# Patient Record
Sex: Female | Born: 1960 | Race: White | Hispanic: No | Marital: Single | Smoking: Never smoker
Health system: Southern US, Community
[De-identification: ages and names within clinical notes are randomized; demographics above are authoritative.]

## PROBLEM LIST (undated history)

## (undated) DIAGNOSIS — E785 Hyperlipidemia, unspecified: Secondary | ICD-10-CM

## (undated) DIAGNOSIS — F419 Anxiety disorder, unspecified: Secondary | ICD-10-CM

## (undated) DIAGNOSIS — I10 Essential (primary) hypertension: Secondary | ICD-10-CM

## (undated) DIAGNOSIS — M1990 Unspecified osteoarthritis, unspecified site: Secondary | ICD-10-CM

## (undated) DIAGNOSIS — J302 Other seasonal allergic rhinitis: Secondary | ICD-10-CM

## (undated) DIAGNOSIS — K219 Gastro-esophageal reflux disease without esophagitis: Secondary | ICD-10-CM

## (undated) HISTORY — DX: Gastro-esophageal reflux disease without esophagitis: K21.9

## (undated) HISTORY — DX: Other seasonal allergic rhinitis: J30.2

## (undated) HISTORY — DX: Hyperlipidemia, unspecified: E78.5

## (undated) HISTORY — DX: Essential (primary) hypertension: I10

## (undated) HISTORY — DX: Unspecified osteoarthritis, unspecified site: M19.90

## (undated) HISTORY — DX: Anxiety disorder, unspecified: F41.9

---

## 1998-05-19 ENCOUNTER — Other Ambulatory Visit: Admission: RE | Admit: 1998-05-19 | Discharge: 1998-05-19 | Payer: Self-pay | Admitting: Obstetrics & Gynecology

## 1999-11-06 ENCOUNTER — Emergency Department (HOSPITAL_COMMUNITY): Admission: EM | Admit: 1999-11-06 | Discharge: 1999-11-07 | Payer: Self-pay | Admitting: *Deleted

## 1999-11-07 ENCOUNTER — Encounter: Payer: Self-pay | Admitting: Emergency Medicine

## 1999-12-13 HISTORY — PX: LAPAROSCOPIC CHOLECYSTECTOMY: SUR755

## 2000-04-04 ENCOUNTER — Other Ambulatory Visit: Admission: RE | Admit: 2000-04-04 | Discharge: 2000-04-04 | Payer: Self-pay | Admitting: Obstetrics and Gynecology

## 2001-10-03 ENCOUNTER — Encounter: Admission: RE | Admit: 2001-10-03 | Discharge: 2001-10-03 | Payer: Self-pay | Admitting: Gastroenterology

## 2001-10-03 ENCOUNTER — Encounter: Payer: Self-pay | Admitting: Gastroenterology

## 2001-10-04 ENCOUNTER — Encounter: Admission: RE | Admit: 2001-10-04 | Discharge: 2001-10-04 | Payer: Self-pay | Admitting: Gastroenterology

## 2001-10-04 ENCOUNTER — Encounter: Payer: Self-pay | Admitting: Gastroenterology

## 2001-12-31 ENCOUNTER — Other Ambulatory Visit: Admission: RE | Admit: 2001-12-31 | Discharge: 2001-12-31 | Payer: Self-pay | Admitting: Obstetrics and Gynecology

## 2002-01-31 ENCOUNTER — Encounter: Payer: Self-pay | Admitting: Obstetrics and Gynecology

## 2002-01-31 ENCOUNTER — Ambulatory Visit (HOSPITAL_COMMUNITY): Admission: RE | Admit: 2002-01-31 | Discharge: 2002-01-31 | Payer: Self-pay | Admitting: Obstetrics and Gynecology

## 2002-04-24 ENCOUNTER — Ambulatory Visit (HOSPITAL_COMMUNITY): Admission: RE | Admit: 2002-04-24 | Discharge: 2002-04-24 | Payer: Self-pay | Admitting: Gastroenterology

## 2002-05-11 ENCOUNTER — Emergency Department (HOSPITAL_COMMUNITY): Admission: EM | Admit: 2002-05-11 | Discharge: 2002-05-12 | Payer: Self-pay | Admitting: Emergency Medicine

## 2002-05-12 ENCOUNTER — Encounter: Payer: Self-pay | Admitting: Emergency Medicine

## 2003-03-27 ENCOUNTER — Other Ambulatory Visit: Admission: RE | Admit: 2003-03-27 | Discharge: 2003-03-27 | Payer: Self-pay | Admitting: Obstetrics and Gynecology

## 2003-05-06 ENCOUNTER — Inpatient Hospital Stay (HOSPITAL_COMMUNITY): Admission: RE | Admit: 2003-05-06 | Discharge: 2003-05-09 | Payer: Self-pay | Admitting: Obstetrics and Gynecology

## 2004-12-12 HISTORY — PX: ABDOMINAL HYSTERECTOMY: SHX81

## 2006-03-29 ENCOUNTER — Ambulatory Visit (HOSPITAL_COMMUNITY): Admission: RE | Admit: 2006-03-29 | Discharge: 2006-03-29 | Payer: Self-pay | Admitting: Gastroenterology

## 2006-04-28 ENCOUNTER — Emergency Department (HOSPITAL_COMMUNITY): Admission: EM | Admit: 2006-04-28 | Discharge: 2006-04-28 | Payer: Self-pay | Admitting: Emergency Medicine

## 2006-05-03 ENCOUNTER — Encounter: Admission: RE | Admit: 2006-05-03 | Discharge: 2006-05-03 | Payer: Self-pay | Admitting: Gastroenterology

## 2012-04-09 ENCOUNTER — Encounter: Payer: Self-pay | Admitting: Gastroenterology

## 2012-07-17 ENCOUNTER — Ambulatory Visit (INDEPENDENT_AMBULATORY_CARE_PROVIDER_SITE_OTHER): Payer: Self-pay | Admitting: Family Medicine

## 2012-07-17 VITALS — BP 142/80 | HR 107 | Temp 98.2°F | Resp 18 | Ht 67.0 in | Wt 180.0 lb

## 2012-07-17 DIAGNOSIS — M7022 Olecranon bursitis, left elbow: Secondary | ICD-10-CM

## 2012-07-17 DIAGNOSIS — M7020 Olecranon bursitis, unspecified elbow: Secondary | ICD-10-CM

## 2012-07-17 MED ORDER — DOXYCYCLINE HYCLATE 100 MG PO TABS: 100.0000 mg | ORAL_TABLET | Freq: Two times a day (BID) | ORAL | Status: AC

## 2012-07-17 NOTE — Progress Notes (Signed)
51 year old woman who comes in with left elbow pain and swelling along with redness and warmth. She had a headache yesterday.  Her problem began after she bumped her elbow couple weeks ago and then recently (2 days ago) try popping a pimple. The subsequent redness, pain, warmth rapidly developed for the last 24 hours.   Objective: No acute distress  Left elbow shows full range of motion at the olecranon area of the left elbow is red, warm, and there is a small effusion in the bursal sac   1. Olecranon bursitis of left elbow  doxycycline (VIBRA-TABS) 100 MG tablet

## 2012-07-19 ENCOUNTER — Ambulatory Visit: Payer: Self-pay | Admitting: Internal Medicine

## 2012-07-19 VITALS — BP 125/81 | HR 96 | Temp 98.3°F | Resp 18 | Ht 67.0 in | Wt 179.0 lb

## 2012-07-19 DIAGNOSIS — L02419 Cutaneous abscess of limb, unspecified: Secondary | ICD-10-CM

## 2012-07-19 DIAGNOSIS — M25522 Pain in left elbow: Secondary | ICD-10-CM

## 2012-07-19 DIAGNOSIS — IMO0002 Reserved for concepts with insufficient information to code with codable children: Secondary | ICD-10-CM

## 2012-07-19 DIAGNOSIS — M25529 Pain in unspecified elbow: Secondary | ICD-10-CM

## 2012-07-19 MED ORDER — HYDROCODONE-ACETAMINOPHEN 5-500 MG PO TABS: 1.0000 | ORAL_TABLET | Freq: Three times a day (TID) | ORAL | Status: DC | PRN

## 2012-07-19 MED ORDER — CEFTRIAXONE SODIUM 1 G IJ SOLR
1.0000 g | Freq: Once | INTRAMUSCULAR | Status: AC
  Administered 2012-07-19: 1 g via INTRAMUSCULAR

## 2012-07-19 NOTE — Progress Notes (Signed)
Procedure: Left elbow abscess aspiration   VCO  Sterile prep with betadine swab then alcohol swab. Local anesthesia with Lidocaine 2% plain, 3 cc by medial approach. Betadine swab/alcohol repeated. Abscess aspirated with 10 cc syringe/20 gauge needle. 2.5 cc serosanguinous fluid aspirated. Cleansed and dressed. Area of cellulitis marked with pen

## 2012-07-19 NOTE — Progress Notes (Signed)
  Subjective:    Patient ID: Regina Mercado, female    DOB: Apr 14, 1961, 51 y.o.   MRN: [id number]  HPI Elbow is worse, has abscess at olecranon and is larger and erythema has spread up and down from elbow. Pain worse Doxy is causing esophageal ache, explained how to take doxy properly.  Review of Systems     Objective:   Physical Exam Red warm left elbow and arm Full rom, nms intact       Assessment & Plan:  Rocephin 1g I and D abscess Wound care [facility]

## 2012-07-19 NOTE — Patient Instructions (Signed)

## 2012-07-20 ENCOUNTER — Encounter: Payer: Self-pay | Admitting: Internal Medicine

## 2012-07-20 ENCOUNTER — Ambulatory Visit (INDEPENDENT_AMBULATORY_CARE_PROVIDER_SITE_OTHER): Payer: Self-pay | Admitting: Internal Medicine

## 2012-07-20 VITALS — BP 132/72 | HR 111 | Temp 99.1°F | Resp 16 | Ht 66.0 in | Wt 179.0 lb

## 2012-07-20 DIAGNOSIS — M25539 Pain in unspecified wrist: Secondary | ICD-10-CM

## 2012-07-20 DIAGNOSIS — L0291 Cutaneous abscess, unspecified: Secondary | ICD-10-CM

## 2012-07-20 LAB — POCT CBC
HCT, POC: 42.2 % (ref 37.7–47.9)
MCH, POC: 28.2 pg (ref 27–31.2)
MCV: 90.9 fL (ref 80–97)
MID (cbc): 0.6 (ref 0–0.9)
POC Granulocyte: 7.2 — AB (ref 2–6.9)
POC LYMPH PERCENT: 21.4 %L (ref 10–50)
Platelet Count, POC: 325 10*3/uL (ref 142–424)
RDW, POC: 13.8 %

## 2012-07-20 MED ORDER — CEFTRIAXONE SODIUM 1 G IJ SOLR
1.0000 g | Freq: Once | INTRAMUSCULAR | Status: AC
  Administered 2012-07-20: 1 g via INTRAMUSCULAR

## 2012-07-20 NOTE — Progress Notes (Signed)
  Subjective:    Patient ID: Regina Mercado, female    DOB: [birth year], 51 y.o.   MRN: [id number]  HPI Has cellulitis and pain left elbow, no better today.See w/up from yesterday. Has no fever and no systemic sxs. Feels well except for arm No insurance  Review of Systems     Objective:   Physical Exam Left elbow red, warm, swollen, and tender   Cbc Results for orders placed in visit on 07/20/12  POCT CBC      Component Value Range   WBC 10.0  4.6 - 10.2 K/uL   Lymph, poc 2.1  0.6 - 3.4   POC LYMPH PERCENT 21.4  10 - 50 %L   MID (cbc) 0.6  0 - 0.9   POC MID % 6.1  0 - 12 %M   POC Granulocyte 7.2 (*) 2 - 6.9   Granulocyte percent 72.5  37 - 80 %G   RBC 4.64  4.04 - 5.48 M/uL   Hemoglobin 13.1  12.2 - 16.2 g/dL   HCT, POC 40.9  81.1 - 47.9 %   MCV 90.9  80 - 97 fL   MCH, POC 28.2  27 - 31.2 pg   MCHC 31.0 (*) 31.8 - 35.4 g/dL   RDW, POC 91.4     Platelet Count, POC 325  142 - 424 K/uL   MPV 9.5  0 - 99.8 fL       Assessment & Plan:  Rocephin 1g Rest/warm salt soaks/sling RTC  Am and see Dr. Cleta Alberts consider rocephin

## 2012-07-21 ENCOUNTER — Encounter (HOSPITAL_COMMUNITY): Payer: Self-pay | Admitting: Family Medicine

## 2012-07-21 ENCOUNTER — Emergency Department (HOSPITAL_COMMUNITY)
Admission: EM | Admit: 2012-07-21 | Discharge: 2012-07-21 | Disposition: A | Discharge status: Home / Self Care | Payer: Self-pay | Attending: Emergency Medicine | Admitting: Emergency Medicine

## 2012-07-21 DIAGNOSIS — IMO0002 Reserved for concepts with insufficient information to code with codable children: Secondary | ICD-10-CM | POA: Insufficient documentation

## 2012-07-21 DIAGNOSIS — L03114 Cellulitis of left upper limb: Secondary | ICD-10-CM

## 2012-07-21 LAB — BASIC METABOLIC PANEL
CO2: 27 mEq/L (ref 19–32)
Chloride: 102 mEq/L (ref 96–112)
Creatinine, Ser: 0.58 mg/dL (ref 0.50–1.10)
GFR calc Af Amer: >90 mL/min (ref >90)
GFR calc non Af Amer: >90 mL/min (ref >90)
Glucose, Bld: 110 mg/dL — ABNORMAL HIGH (ref 70–99)
Sodium: 138 mEq/L (ref 135–145)

## 2012-07-21 LAB — CBC WITH DIFFERENTIAL/PLATELET
Basophils Absolute: 0 10*3/uL (ref 0.0–0.1)
Basophils Relative: 0 % (ref 0–1)
Eosinophils Absolute: 0.2 10*3/uL (ref 0.0–0.7)
Hemoglobin: 11.7 g/dL — ABNORMAL LOW (ref 12.0–15.0)
Lymphocytes Relative: 26 % (ref 12–46)
MCH: 29 pg (ref 26.0–34.0)
MCHC: 33.6 g/dL (ref 30.0–36.0)
Monocytes Absolute: 0.7 10*3/uL (ref 0.1–1.0)
Neutro Abs: 5.3 10*3/uL (ref 1.7–7.7)
Platelets: 281 10*3/uL (ref 150–400)
RBC: 4.03 MIL/uL (ref 3.87–5.11)
RDW: 12.6 % (ref 11.5–15.5)
WBC: 8.3 10*3/uL (ref 4.0–10.5)

## 2012-07-21 MED ORDER — CLINDAMYCIN PHOSPHATE 300 MG/50ML IV SOLN
300.0000 mg | Freq: Once | INTRAVENOUS | Status: AC
  Administered 2012-07-21: 300 mg via INTRAVENOUS
  Filled 2012-07-21: qty 50

## 2012-07-21 NOTE — ED Notes (Signed)
Patient has area of redness to her left forearm. Line of demarkation marked by urgent care ctr that has been following her care. States that she had her left elbow drained and is currently on Doxycycline. Has had to return to the urgent care ctr at least 3 times for increased redness and received Rocephin injections. Here tonite because area of redness is extending past line of demarkation.

## 2012-07-21 NOTE — ED Provider Notes (Signed)
History     CSN: [id number]  Arrival date & time 07/21/12  4098   First MD Initiated Contact with Patient 07/21/12 [phone]      Chief Complaint  Patient presents with  . Cellulitis    (Consider location/radiation/quality/duration/timing/severity/associated sxs/prior treatment) HPI  51 year old female presents for evaluation and management of abscess cellulitis on the left elbow. Patient reports she accidentally bumped the elbow several weeks ago and then 5 days ago she saw a pimple and attempt to pop it. She notice subsequent warmth, redness, and swelling which started a day after that incident.  She has been to Urgent care several times for her complaints.  Was given abx including rocephin, and currently taking Doxycycline.  Had an I&D procedure 2 days ago to L elbow.  Was recommended to return if worsen. Sts she still notices swelling to hand, pain, and no improvement.  Denies fever, chills, n/v, numbness or bleeding.  No hx of diabetes.     History reviewed. No pertinent past medical history.  Past Surgical History  Procedure Date  . Abdominal hysterectomy     No family history on file.  History  Substance Use Topics  . Smoking status: Never Smoker   . Smokeless tobacco: Not on file  . Alcohol Use: No    OB History    Grav Para Term Preterm Abortions TAB SAB Ect Mult Living                  Review of Systems  All other systems reviewed and are negative.    Allergies  Erythromycin and Sudafed  Home Medications   Current Outpatient Rx  Name Route Sig Dispense Refill  . DOXYCYCLINE HYCLATE 100 MG PO TABS Oral Take 1 tablet (100 mg total) by mouth 2 (two) times daily. 20 tablet 0  . HYDROCODONE-ACETAMINOPHEN 5-500 MG PO TABS Oral Take 1 tablet by mouth every 8 (eight) hours as needed for pain. 36 tablet 0  . IMIPRAMINE HCL 50 MG PO TABS Oral Take 50 mg by mouth at bedtime.    Marland Kitchen RANITIDINE HCL 150 MG PO TABS Oral Take 150 mg by mouth 2 (two) times daily.       BP 150/94  Pulse 104  Temp 97.8 F (36.6 C) (Oral)  Resp 16  SpO2 98%  Physical Exam  Nursing note and vitals reviewed. Constitutional: She is oriented to person, place, and time. She appears well-developed and well-nourished. No distress.  HENT:  Head: Normocephalic and atraumatic.  Mouth/Throat: Oropharynx is clear and moist.  Eyes: Conjunctivae are normal.  Neck: Normal range of motion. Neck supple.  Cardiovascular: Normal rate and regular rhythm.  Exam reveals no gallop and no friction rub.   No murmur heard. Pulmonary/Chest: Effort normal. No respiratory distress. She exhibits no tenderness.  Abdominal: Soft. There is no tenderness.  Musculoskeletal: She exhibits edema.       Dependent edema to L hand  Neurological: She is alert and oriented to person, place, and time.  Skin: Skin is warm. Rash noted.       Very faint receding erythema to L forearm and elbow from marked margin.  Evidence of dependent edema to L hand.  L elbow with mild edema and erythema to olecranon region, mildly tender on palpation.  No petechia, pustular, or vesicular lesion.    Psychiatric: She has a normal mood and affect.    ED Course  Procedures (including critical care time)   Labs Reviewed  CBC WITH DIFFERENTIAL  BASIC METABOLIC PANEL   Results for orders placed during the hospital encounter of 07/21/12  CBC WITH DIFFERENTIAL      Component Value Range   WBC 8.3  4.0 - 10.5 K/uL   RBC 4.03  3.87 - 5.11 MIL/uL   Hemoglobin 11.7 (*) 12.0 - 15.0 g/dL   HCT 40.9 (*) 81.1 - 91.4 %   MCV 86.4  78.0 - 100.0 fL   MCH 29.0  26.0 - 34.0 pg   MCHC 33.6  30.0 - 36.0 g/dL   RDW 78.2  95.6 - 21.3 %   Platelets 281  150 - 400 K/uL   Neutrophils Relative 63  43 - 77 %   Neutro Abs 5.3  1.7 - 7.7 K/uL   Lymphocytes Relative 26  12 - 46 %   Lymphs Abs 2.2  0.7 - 4.0 K/uL   Monocytes Relative 8  3 - 12 %   Monocytes Absolute 0.7  0.1 - 1.0 K/uL   Eosinophils Relative 2  0 - 5 %   Eosinophils  Absolute 0.2  0.0 - 0.7 K/uL   Basophils Relative 0  0 - 1 %   Basophils Absolute 0.0  0.0 - 0.1 K/uL  BASIC METABOLIC PANEL      Component Value Range   Sodium 138  135 - 145 mEq/L   Potassium 3.6  3.5 - 5.1 mEq/L   Chloride 102  96 - 112 mEq/L   CO2 27  19 - 32 mEq/L   Glucose, Bld 110 (*) 70 - 99 mg/dL   BUN 9  6 - 23 mg/dL   Creatinine, Ser 0.86  0.50 - 1.10 mg/dL   Calcium 8.8  8.4 - 57.8 mg/dL   GFR calc non Af Amer >90  >90 mL/min   GFR calc Af Amer >90  >90 mL/min   No results found.   1. L forearm cellulitis, resolving  MDM  Improving cellulitis of L forearm.  Pt currently taking doxy.  Since pt still apprehensive and continue to c/o of swelling and redness, will give clindamycin 300mg  IV here in ED.  Reassurance given.  Recommend continue doxy.  She is currently afebrile, nontoxic in appearance.     5:34 AM VSS, afebrile, normal WBC.  Pt finished receiving her IV abx here in ED.  Request to be discharge.  Pt will continue her medication.  Strict return precaution discussed.       Fayrene Helper, PA-C 07/21/12 [phone]

## 2012-07-21 NOTE — ED Notes (Signed)
Per patient she is currently on Doxycycline but redness looks like it has been spreading.

## 2012-07-22 LAB — WOUND CULTURE
Gram Stain: NONE SEEN
Gram Stain: NONE SEEN

## 2012-07-22 NOTE — ED Provider Notes (Signed)
Medical screening examination/treatment/procedure(s) were performed by non-physician practitioner and as supervising physician I was immediately available for consultation/collaboration.  Raeford Razor, MD 07/22/12 [phone]

## 2012-07-28 ENCOUNTER — Telehealth: Payer: Self-pay

## 2012-07-28 NOTE — Telephone Encounter (Signed)
PATIENT STATES SHE CAME IN FOR CELULITIS OF HER (L) ELBOW. IT IS DOING A LOT BETTER BECAUSE IT IS NOT AS PAINFUL AS IT WAS. HOWEVER, IT IS STILL RED, SWOLLEN AND PEELING. SHE THINKS SHE MAY NEED TO GET ANOTHER ROUND OF THE ANTIBOTIC.  BEST PHONE [phone] BEFORE 5:00.  AFTER 5:00 PLEASE CALL [phone] (CELL)   PHARMACY CHOICE IS [facility] ([address])  MBC

## 2012-07-31 ENCOUNTER — Telehealth: Payer: Self-pay

## 2012-07-31 MED ORDER — DOXYCYCLINE HYCLATE 100 MG PO CAPS: 100.0000 mg | ORAL_CAPSULE | Freq: Two times a day (BID) | ORAL | Status: AC

## 2012-07-31 NOTE — Telephone Encounter (Signed)
I sent in another round of Doxycycline for her. If she has any concerns that this is not healing I want her to come in right away.

## 2012-07-31 NOTE — Telephone Encounter (Signed)
Notified pt that RF was sent in and gave instr's for RTC if not improving. Pt agreed.

## 2012-07-31 NOTE — Telephone Encounter (Signed)
Pt is returning [place] Riggs's call from earlier today.  Best# [phone]

## 2012-07-31 NOTE — Telephone Encounter (Signed)
LMOM to CB on cell # [phone] (it is listed incorrectly in phone message)

## 2013-04-18 ENCOUNTER — Other Ambulatory Visit: Payer: Self-pay | Admitting: Family Medicine

## 2013-04-18 ENCOUNTER — Other Ambulatory Visit: Payer: Self-pay

## 2013-04-18 DIAGNOSIS — Z1231 Encounter for screening mammogram for malignant neoplasm of breast: Secondary | ICD-10-CM

## 2013-05-24 ENCOUNTER — Ambulatory Visit
Admission: RE | Admit: 2013-05-24 | Discharge: 2013-05-24 | Disposition: A | Discharge status: Home / Self Care | Payer: BC Managed Care – PPO | Source: Ambulatory Visit | Attending: Family Medicine | Admitting: Family Medicine

## 2013-05-24 DIAGNOSIS — Z1231 Encounter for screening mammogram for malignant neoplasm of breast: Secondary | ICD-10-CM

## 2013-07-03 ENCOUNTER — Encounter: Payer: Self-pay | Admitting: Gastroenterology

## 2013-09-13 ENCOUNTER — Encounter: Payer: Self-pay | Admitting: Gastroenterology

## 2013-09-13 ENCOUNTER — Ambulatory Visit (AMBULATORY_SURGERY_CENTER): Payer: Self-pay | Admitting: *Deleted

## 2013-09-13 VITALS — Ht 68.0 in | Wt 186.4 lb

## 2013-09-13 DIAGNOSIS — Z1211 Encounter for screening for malignant neoplasm of colon: Secondary | ICD-10-CM

## 2013-09-13 MED ORDER — PREPOPIK 10-3.5-12 MG-GM-GM PO PACK: 1.0000 | PACK | Freq: Once | ORAL | Status: DC

## 2013-09-13 NOTE — Progress Notes (Signed)
No egg or soy allergy. No anesthesia problems.  

## 2013-09-18 ENCOUNTER — Telehealth: Payer: Self-pay | Admitting: Gastroenterology

## 2013-09-18 NOTE — Telephone Encounter (Signed)
Spoke with pt and told her I would leave a prep sample at the desk for her to pick up.  Understanding voiced

## 2013-09-27 ENCOUNTER — Telehealth: Payer: Self-pay | Admitting: *Deleted

## 2013-09-27 ENCOUNTER — Encounter: Payer: BC Managed Care – PPO | Admitting: Gastroenterology

## 2013-09-27 ENCOUNTER — Telehealth: Payer: Self-pay | Admitting: Gastroenterology

## 2013-09-27 NOTE — Telephone Encounter (Signed)
Returned patient's call and she states that she took one of her daughter's Zofran at 8 am and is still feeling very nauseated.  She has not started her Mag. Citrate or taken Phenergan suppositories as prescribed by Dr. Russella Dar.  She states that her stools are still brown.  I told her I would talk with Dr. Russella Dar and call her back.   I spoke with Dr. Russella Dar and he suggested that we do her at 1:00 pm and for patient to take the phenergan suppository now as prescribed.  I asked Dorene Grebe and she said we could do her at 1:00 pm.  I  Called patient and she said she would be here at 12:30 pm and will take the phenergan and mag. Citrate.  I advised her to call us if she was still unable to take the prep and we would reschedule her appointment.  She understood and agreed.

## 2013-09-27 NOTE — Telephone Encounter (Signed)
Called pt to see how she was doing with prep.  She says she was able to finish one bottle of the prep but felt nauseated afterwards.  She has not moved her bowels since then.  Her stools are watery brown but still have solid.  She continues to feel nauseated.  Spoke with Dr Russella Dar and he feels should reschedule and that it would be best to premedicate for her nausea before prepping.  I returned her call and explained all of this to her.  She will call back to reschedule at her convenience.

## 2013-09-27 NOTE — Telephone Encounter (Signed)
On call note. Pt ongoing vomiting this am with second dose of Prepopik. Given Phenergan 25 mg supp q6h #2 and changed to Mag Citrate x 2 to be taken after Phenergan

## 2013-11-20 ENCOUNTER — Encounter: Payer: Self-pay | Admitting: Gastroenterology

## 2014-01-07 ENCOUNTER — Encounter: Payer: Self-pay | Admitting: Gastroenterology

## 2014-01-31 ENCOUNTER — Encounter: Payer: BC Managed Care – PPO | Admitting: Gastroenterology

## 2014-02-21 ENCOUNTER — Encounter: Payer: Self-pay | Admitting: *Deleted

## 2014-03-07 ENCOUNTER — Encounter: Payer: BC Managed Care – PPO | Admitting: Gastroenterology

## 2014-03-21 ENCOUNTER — Encounter: Payer: BC Managed Care – PPO | Admitting: Gastroenterology

## 2014-04-21 ENCOUNTER — Telehealth: Payer: Self-pay | Admitting: *Deleted

## 2014-04-21 NOTE — Telephone Encounter (Signed)
Let's try MoviPrep with Reglan 10 mg before each dose.

## 2014-04-21 NOTE — Telephone Encounter (Signed)
Dr Russella DarStark: pt is scheduled for direct screening colonoscopy 5/27.  She was previously scheduled in October but was unable to tolerate Prepopik.  (See phone note 09/27/13)  I talked with pt by phone and she says she only took evening dose of Prepopik and did not try to take Magnesium Citrate as suggested because of nausea.  [facility] What do you want her to do for prep and what do you want her to take for nausea med prior to taking prep?  Thanks, Olegario MessierKathy

## 2014-04-21 NOTE — Telephone Encounter (Signed)
noted 

## 2014-04-24 ENCOUNTER — Ambulatory Visit (AMBULATORY_SURGERY_CENTER): Payer: Self-pay | Admitting: *Deleted

## 2014-04-24 VITALS — Ht 69.0 in | Wt 194.2 lb

## 2014-04-24 DIAGNOSIS — Z1211 Encounter for screening for malignant neoplasm of colon: Secondary | ICD-10-CM

## 2014-04-24 MED ORDER — MOVIPREP 100 G PO SOLR: ORAL | Status: DC

## 2014-04-24 MED ORDER — METOCLOPRAMIDE HCL 10 MG PO TABS: 10.0000 mg | ORAL_TABLET | ORAL | Status: AC

## 2014-04-24 NOTE — Progress Notes (Signed)
No allergies to eggs or soy. No problems with anesthesia.  Pt given Emmi instructions for colonoscopy  No oxygen use  No diet drug use  

## 2014-04-28 ENCOUNTER — Encounter: Payer: Self-pay | Admitting: Gastroenterology

## 2014-05-01 ENCOUNTER — Telehealth: Payer: Self-pay | Admitting: Gastroenterology

## 2014-05-01 NOTE — Telephone Encounter (Signed)
[facility] free MoviPrep

## 2014-05-07 ENCOUNTER — Encounter: Payer: Self-pay | Admitting: Gastroenterology

## 2014-05-07 ENCOUNTER — Ambulatory Visit (AMBULATORY_SURGERY_CENTER): Payer: BC Managed Care – PPO | Admitting: Gastroenterology

## 2014-05-07 VITALS — BP 155/88 | HR 90 | Temp 98.4°F | Resp 16 | Ht 69.0 in | Wt 194.0 lb

## 2014-05-07 DIAGNOSIS — Z1211 Encounter for screening for malignant neoplasm of colon: Secondary | ICD-10-CM

## 2014-05-07 MED ORDER — SODIUM CHLORIDE 0.9 % IV SOLN: 500.0000 mL | INTRAVENOUS | Status: DC

## 2014-05-07 NOTE — Patient Instructions (Signed)
YOU HAD AN ENDOSCOPIC PROCEDURE TODAY AT THE [facility]: Refer to the procedure report that was given to you for any specific questions about what was found during the examination.  If the procedure report does not answer your questions, please call your gastroenterologist to clarify.  If you requested that your care partner not be given the details of your procedure findings, then the procedure report has been included in a sealed envelope for you to review at your convenience later.  YOU SHOULD EXPECT: Some feelings of bloating in the abdomen. Passage of more gas than usual.  Walking can help get rid of the air that was put into your GI tract during the procedure and reduce the bloating. If you had a lower endoscopy (such as a colonoscopy or flexible sigmoidoscopy) you may notice spotting of blood in your stool or on the toilet paper. If you underwent a bowel prep for your procedure, then you may not have a normal bowel movement for a few days.  DIET: Your first meal following the procedure should be a light meal and then it is ok to progress to your normal diet.  A half-sandwich or bowl of soup is an example of a good first meal.  Heavy or fried foods are harder to digest and may make you feel nauseous or bloated.  Likewise meals heavy in dairy and vegetables can cause extra gas to form and this can also increase the bloating.  Drink plenty of fluids but you should avoid alcoholic beverages for 24 hours.  ACTIVITY: Your care partner should take you home directly after the procedure.  You should plan to take it easy, moving slowly for the rest of the day.  You can resume normal activity the day after the procedure however you should NOT DRIVE or use heavy machinery for 24 hours (because of the sedation medicines used during the test).    SYMPTOMS TO REPORT IMMEDIATELY: A gastroenterologist can be reached at any hour.  During normal business hours, 8:30 AM to 5:00 PM Monday through Friday,  call [phone].  After hours and on weekends, please call the GI answering service at [phone] who will take a message and have the physician on call contact you.   Following lower endoscopy (colonoscopy or flexible sigmoidoscopy):  Excessive amounts of blood in the stool  Significant tenderness or worsening of abdominal pains  Swelling of the abdomen that is new, acute  Fever of 100F or higher    FOLLOW UP: If any biopsies were taken you will be contacted by phone or by letter within the next 1-3 weeks.  Call your gastroenterologist if you have not heard about the biopsies in 3 weeks.  Our staff will call the home number listed on your records the next business day following your procedure to check on you and address any questions or concerns that you may have at that time regarding the information given to you following your procedure. This is a courtesy call and so if there is no answer at the home number and we have not heard from you through the emergency physician on call, we will assume that you have returned to your regular daily activities without incident.  SIGNATURES/CONFIDENTIALITY: You and/or your care partner have signed paperwork which will be entered into your electronic medical record.  These signatures attest to the fact that that the information above on your After Visit Summary has been reviewed and is understood.  Full responsibility of the confidentiality   of this discharge information lies with you and/or your care-partner.     

## 2014-05-07 NOTE — Progress Notes (Signed)
Report to PACU, RN, vss, BBS= Clear.  

## 2014-05-07 NOTE — Op Note (Signed)
[facility] [address] [place], [address]   COLONOSCOPY PROCEDURE REPORT  PATIENT: Regina Mercado, Regina Mercado  MR#: [id number] BIRTHDATE: 09/18/1961 , 52  yrs. old GENDER: Female ENDOSCOPIST: Meryl Dare, MD, [facility] REFERRED BY: Marcelo Baldy PROCEDURE DATE:  05/07/2014 PROCEDURE:   Colonoscopy, screening First Screening Colonoscopy - Avg.  risk and is 50 yrs.  old or older Yes.  Prior Negative Screening - Now for repeat screening. N/A  History of Adenoma - Now for follow-up colonoscopy & has been > or = to 3 yrs.  N/A  Polyps Removed Today? No.  Recommend repeat exam, <10 yrs? No. ASA CLASS:   Class II INDICATIONS:average risk screening. MEDICATIONS: MAC sedation, administered by CRNA and propofol (Diprivan) 340mg  IV DESCRIPTION OF PROCEDURE:   After the risks benefits and alternatives of the procedure were thoroughly explained, informed consent was obtained.  A digital rectal exam revealed no abnormalities of the rectum.   The LB [id number]  endoscope was introduced through the anus and advanced to the cecum, which was identified by both the appendix and ileocecal valve. No adverse events experienced with a tortuous colon.   The quality of the prep was good, using MoviPrep  The instrument was then slowly withdrawn as the colon was fully examined.  COLON FINDINGS: A normal appearing cecum, ileocecal valve, and appendiceal orifice were identified.  The ascending, hepatic flexure, transverse, splenic flexure, descending, sigmoid colon and rectum appeared unremarkable.  No polyps or cancers were seen. Retroflexed views revealed internal hemorrhoids. The time to cecum=6 minutes 55 seconds.  Withdrawal time=11 minutes 52 seconds. The scope was withdrawn and the procedure completed.  COMPLICATIONS: There were no complications.  ENDOSCOPIC IMPRESSION: 1.  Normal colon 2.  Internal hemorrhoids  RECOMMENDATIONS: 1.  You should continue to follow colorectal  cancer screening guidelines for "routine risk" patients with a repeat colonoscopy in 10 years.  There is no need for routine, screening FOBT (stool) testing for at least 5 years.  eSigned:  Meryl Dare, MD, [facility] 05/07/2014 1:57 PM

## 2014-05-08 ENCOUNTER — Telehealth: Payer: Self-pay | Admitting: *Deleted

## 2014-05-08 NOTE — Telephone Encounter (Signed)
  Follow up Call-  Call back number 05/07/2014  Post procedure Call Back phone  # [phone]  Permission to leave phone message Yes     Patient questions:  Do you have a fever, pain , or abdominal swelling? no Pain Score  0 *  Have you tolerated food without any problems? yes  Have you been able to return to your normal activities? yes  Do you have any questions about your discharge instructions: Diet   no Medications  no Follow up visit  no  Do you have questions or concerns about your Care? no  Actions: * If pain score is 4 or above: No action needed, pain <4.

## 2014-07-14 ENCOUNTER — Other Ambulatory Visit: Payer: Self-pay

## 2014-07-14 DIAGNOSIS — Z1231 Encounter for screening mammogram for malignant neoplasm of breast: Secondary | ICD-10-CM

## 2014-07-25 ENCOUNTER — Ambulatory Visit: Payer: BC Managed Care – PPO

## 2014-07-28 ENCOUNTER — Ambulatory Visit: Payer: BC Managed Care – PPO

## 2014-08-03 ENCOUNTER — Emergency Department (HOSPITAL_COMMUNITY)
Admission: EM | Admit: 2014-08-03 | Discharge: 2014-08-03 | Disposition: A | Discharge status: Home / Self Care | Payer: BC Managed Care – PPO | Attending: Emergency Medicine | Admitting: Emergency Medicine

## 2014-08-03 ENCOUNTER — Emergency Department (HOSPITAL_COMMUNITY): Payer: BC Managed Care – PPO

## 2014-08-03 ENCOUNTER — Encounter (HOSPITAL_COMMUNITY): Payer: Self-pay | Admitting: Emergency Medicine

## 2014-08-03 DIAGNOSIS — S20219A Contusion of unspecified front wall of thorax, initial encounter: Secondary | ICD-10-CM | POA: Insufficient documentation

## 2014-08-03 DIAGNOSIS — E785 Hyperlipidemia, unspecified: Secondary | ICD-10-CM | POA: Insufficient documentation

## 2014-08-03 DIAGNOSIS — I10 Essential (primary) hypertension: Secondary | ICD-10-CM | POA: Diagnosis not present

## 2014-08-03 DIAGNOSIS — Y9289 Other specified places as the place of occurrence of the external cause: Secondary | ICD-10-CM | POA: Insufficient documentation

## 2014-08-03 DIAGNOSIS — Y9389 Activity, other specified: Secondary | ICD-10-CM | POA: Diagnosis not present

## 2014-08-03 DIAGNOSIS — M1990 Unspecified osteoarthritis, unspecified site: Secondary | ICD-10-CM | POA: Diagnosis not present

## 2014-08-03 DIAGNOSIS — K219 Gastro-esophageal reflux disease without esophagitis: Secondary | ICD-10-CM | POA: Insufficient documentation

## 2014-08-03 DIAGNOSIS — W230XXA Caught, crushed, jammed, or pinched between moving objects, initial encounter: Secondary | ICD-10-CM | POA: Diagnosis not present

## 2014-08-03 DIAGNOSIS — Z79899 Other long term (current) drug therapy: Secondary | ICD-10-CM | POA: Insufficient documentation

## 2014-08-03 DIAGNOSIS — S298XXA Other specified injuries of thorax, initial encounter: Secondary | ICD-10-CM | POA: Insufficient documentation

## 2014-08-03 DIAGNOSIS — F411 Generalized anxiety disorder: Secondary | ICD-10-CM | POA: Diagnosis not present

## 2014-08-03 MED ORDER — NAPROXEN 500 MG PO TABS
500.0000 mg | ORAL_TABLET | Freq: Two times a day (BID) | ORAL | Status: AC
Start: 2014-08-03 — End: ?

## 2014-08-03 MED ORDER — KETOROLAC TROMETHAMINE 30 MG/ML IJ SOLN
30.0000 mg | Freq: Once | INTRAMUSCULAR | Status: AC
  Administered 2014-08-03: 30 mg via INTRAMUSCULAR
  Filled 2014-08-03: qty 1
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
## 2014-08-03 NOTE — Discharge Instructions (Signed)
Chest Contusion A contusion is a deep bruise. Bruises happen when an injury causes bleeding under the skin. Signs of bruising include pain, puffiness (swelling), and discolored skin. The bruise may turn blue, purple, or yellow.  HOME CARE  Put ice on the injured area.  Put ice in a plastic bag.  Place a towel between the skin and the bag.  Leave the ice on for 15-20 minutes at a time, 03-04 times a day for the first 48 hours.  Only take medicine as told by your doctor.  Rest.  Take deep breaths (deep-breathing exercises) as told by your doctor.  Stop smoking if you smoke.  Do not lift objects over 5 pounds (2.3 kilograms) for 3 days or longer if told by your doctor. GET HELP RIGHT AWAY IF:   You have more bruising or puffiness.  You have pain that gets worse.  You have trouble breathing.  You are dizzy, weak, or pass out (faint).  You have blood in your pee (urine) or poop (stool).  You cough up or throw up (vomit) blood.  Your puffiness or pain is not helped with medicines. MAKE SURE YOU:   Understand these instructions.  Will watch your condition.  Will get help right away if you are not doing well or get worse. Document Released: 05/16/2008 Document Revised: 08/22/2012 Document Reviewed: 05/21/2012 [facility] Patient Information 2015 [place], [place]. This information is not intended to replace advice given to you by your health care provider. Make sure you discuss any questions you have with your health care provider. Your chest and sternum xrays are normal
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
## 2014-08-12 NOTE — ED Provider Notes (Signed)
Medical screening examination/treatment/procedure(s) were performed by non-physician practitioner and as supervising physician I was immediately available for consultation/collaboration.   EKG Interpretation None       Hurman Horn, MD 08/12/14 2042

## 2014-08-13 ENCOUNTER — Ambulatory Visit: Payer: BC Managed Care – PPO

## 2014-09-22 ENCOUNTER — Ambulatory Visit
Admission: RE | Admit: 2014-09-22 | Discharge: 2014-09-22 | Disposition: A | Discharge status: Home / Self Care | Payer: BC Managed Care – PPO | Source: Ambulatory Visit

## 2014-09-22 DIAGNOSIS — Z1231 Encounter for screening mammogram for malignant neoplasm of breast: Secondary | ICD-10-CM

## 2015-09-01 ENCOUNTER — Other Ambulatory Visit: Payer: Self-pay

## 2015-09-01 DIAGNOSIS — Z1231 Encounter for screening mammogram for malignant neoplasm of breast: Secondary | ICD-10-CM

## 2015-09-11 ENCOUNTER — Ambulatory Visit: Payer: Self-pay

## 2016-01-11 ENCOUNTER — Ambulatory Visit
Admission: RE | Admit: 2016-01-11 | Discharge: 2016-01-11 | Disposition: A | Discharge status: Home / Self Care | Payer: BLUE CROSS/BLUE SHIELD | Source: Ambulatory Visit

## 2016-01-11 DIAGNOSIS — Z1231 Encounter for screening mammogram for malignant neoplasm of breast: Secondary | ICD-10-CM

## 2017-05-16 ENCOUNTER — Other Ambulatory Visit: Payer: Self-pay | Admitting: Primary Care

## 2017-05-16 DIAGNOSIS — Z1231 Encounter for screening mammogram for malignant neoplasm of breast: Secondary | ICD-10-CM

## 2017-05-30 ENCOUNTER — Ambulatory Visit
Admission: RE | Admit: 2017-05-30 | Discharge: 2017-05-30 | Disposition: A | Discharge status: Home / Self Care | Payer: BLUE CROSS/BLUE SHIELD | Source: Ambulatory Visit | Attending: Primary Care | Admitting: Primary Care

## 2017-05-30 DIAGNOSIS — Z1231 Encounter for screening mammogram for malignant neoplasm of breast: Secondary | ICD-10-CM

## 2018-06-25 ENCOUNTER — Other Ambulatory Visit: Payer: Self-pay | Admitting: Nurse Practitioner

## 2018-06-25 DIAGNOSIS — Z1231 Encounter for screening mammogram for malignant neoplasm of breast: Secondary | ICD-10-CM

## 2018-07-12 ENCOUNTER — Ambulatory Visit
Admission: RE | Admit: 2018-07-12 | Discharge: 2018-07-12 | Disposition: A | Discharge status: Home / Self Care | Payer: Managed Care, Other (non HMO) | Source: Ambulatory Visit | Attending: Nurse Practitioner | Admitting: Nurse Practitioner

## 2018-07-12 DIAGNOSIS — Z1231 Encounter for screening mammogram for malignant neoplasm of breast: Secondary | ICD-10-CM

## 2018-12-03 ENCOUNTER — Emergency Department (HOSPITAL_COMMUNITY)
Admission: EM | Admit: 2018-12-03 | Discharge: 2018-12-04 | Disposition: A | Discharge status: Home / Self Care | Payer: 59 | Attending: Emergency Medicine | Admitting: Emergency Medicine

## 2018-12-03 ENCOUNTER — Encounter (HOSPITAL_COMMUNITY): Payer: Self-pay | Admitting: Emergency Medicine

## 2018-12-03 DIAGNOSIS — I10 Essential (primary) hypertension: Secondary | ICD-10-CM | POA: Diagnosis not present

## 2018-12-03 DIAGNOSIS — R42 Dizziness and giddiness: Secondary | ICD-10-CM | POA: Diagnosis present

## 2018-12-03 DIAGNOSIS — I951 Orthostatic hypotension: Secondary | ICD-10-CM | POA: Diagnosis not present

## 2018-12-03 DIAGNOSIS — Z79899 Other long term (current) drug therapy: Secondary | ICD-10-CM | POA: Diagnosis not present

## 2018-12-03 DIAGNOSIS — E860 Dehydration: Secondary | ICD-10-CM | POA: Diagnosis not present
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
## 2018-12-03 NOTE — ED Notes (Signed)
Patient stated she has not ate or drank anything all day. Jumped up from sitting position and everything went back, patient felt nauseated after too.

## 2018-12-03 NOTE — ED Notes (Signed)
Bed: [id number] Expected date:  Expected time:  Means of arrival:  Comments: 57 yr old syncopal episode

## 2018-12-04 ENCOUNTER — Emergency Department (HOSPITAL_COMMUNITY): Payer: 59

## 2018-12-04 LAB — CBC WITH DIFFERENTIAL/PLATELET
Abs Immature Granulocytes: 0.09 10*3/uL — ABNORMAL HIGH (ref 0.00–0.07)
BASOS PCT: 0 %
Basophils Absolute: 0 10*3/uL (ref 0.0–0.1)
EOS ABS: 0 10*3/uL (ref 0.0–0.5)
EOS PCT: 0 %
HEMATOCRIT: 39.5 % (ref 36.0–46.0)
Hemoglobin: 12.8 g/dL (ref 12.0–15.0)
Immature Granulocytes: 1 %
Lymphocytes Relative: 10 %
Lymphs Abs: 1.5 10*3/uL (ref 0.7–4.0)
MCH: 28.8 pg (ref 26.0–34.0)
MCHC: 32.4 g/dL (ref 30.0–36.0)
MCV: 89 fL (ref 80.0–100.0)
Monocytes Absolute: 0.8 10*3/uL (ref 0.1–1.0)
Monocytes Relative: 6 %
NRBC: 0 % (ref 0.0–0.2)
Neutro Abs: 12.2 10*3/uL — ABNORMAL HIGH (ref 1.7–7.7)
Neutrophils Relative %: 83 %
Platelets: 308 10*3/uL (ref 150–400)
RBC: 4.44 MIL/uL (ref 3.87–5.11)
RDW: 12.4 % (ref 11.5–15.5)
WBC: 14.7 10*3/uL — ABNORMAL HIGH (ref 4.0–10.5)

## 2018-12-04 LAB — BASIC METABOLIC PANEL
Anion gap: 10 (ref 5–15)
BUN: 15 mg/dL (ref 6–20)
CALCIUM: 9.1 mg/dL (ref 8.9–10.3)
CO2: 29 mmol/L (ref 22–32)
Chloride: 98 mmol/L (ref 98–111)
Creatinine, Ser: 0.77 mg/dL (ref 0.44–1.00)
GFR calc Af Amer: >60 mL/min (ref >60)
GFR calc non Af Amer: >60 mL/min (ref >60)
GLUCOSE: 132 mg/dL — AB (ref 70–99)
Potassium: 3.9 mmol/L (ref 3.5–5.1)
Sodium: 137 mmol/L (ref 135–145)

## 2018-12-04 LAB — INFLUENZA PANEL BY PCR (TYPE A & B)
Influenza A By PCR: NEGATIVE
Influenza B By PCR: NEGATIVE

## 2018-12-04 MED ORDER — SODIUM CHLORIDE 0.9 % IV BOLUS
1000.0000 mL | Freq: Once | INTRAVENOUS | Status: AC
  Administered 2018-12-04: 1000 mL via INTRAVENOUS

## 2018-12-04 NOTE — ED Notes (Signed)
Patient transported to X-ray 

## 2018-12-04 NOTE — ED Provider Notes (Signed)
[facility]-EMERGENCY DEPT Provider Note   CSN: [id number] Arrival date & time: 12/03/18  2241     History   Chief Complaint Chief Complaint  Patient presents with  . Hypotension    HPI Regina Mercado is a 57 y.o. female presenting today for lightheadedness that occurred around dinnertime last night.  Patient states that she was sleeping on the couch when she woke up and remembered that she had to take her kids into bed.  Patient states that she jumped off the couch to walk across the room when she became lightheaded and had to sit down and felt nauseous.  Patient states that she helped herself to the ground denies fall or injury.  Patient denies syncope, she states that she only felt lightheaded.  Patient states that lightheadedness passed after a few moments and then she was able to return to her normal activities and called EMS who noted her to be hypotensive upon their orthostatic vs.  Patient states that she has been very busy the past few days and has not been eating or drinking like she normally does.  Patient states that she has had this happen 1 time in the past many years ago and was informed that she was dehydrated at that time.  Patient denies fever, vomiting, diarrhea, headache/vision changes, chest pain/shortness of breath, abdominal pain, extremity numbness/tingling or weakness or any other concerns at this time.  HPI  Past Medical History:  Diagnosis Date  . Anxiety   . Arthritis   . GERD (gastroesophageal reflux disease)   . Hyperlipidemia   . Hypertension   . Seasonal allergies     There are no active problems to display for this patient.   Past Surgical History:  Procedure Laterality Date  . ABDOMINAL HYSTERECTOMY  2006  . LAPAROSCOPIC CHOLECYSTECTOMY  2001     OB History   No obstetric history on file.      Home Medications    Prior to Admission medications   Medication Sig Start Date End Date Taking? Authorizing Provider   acetaminophen (TYLENOL) 500 MG tablet Take 500 mg by mouth every 6 (six) hours as needed for mild pain.    Yes [provider]  atorvastatin (LIPITOR) 10 MG tablet Take 10 mg by mouth at bedtime.    Yes [provider]  esomeprazole (NEXIUM) 20 MG capsule Take 20 mg by mouth daily as needed.   Yes [provider]  hydrochlorothiazide (HYDRODIURIL) 25 MG tablet Take 25 mg by mouth at bedtime.   Yes [provider]  ibuprofen (ADVIL,MOTRIN) 200 MG tablet Take 200 mg by mouth every 6 (six) hours as needed for mild pain.    Yes [provider]  imipramine (TOFRANIL) 50 MG tablet Take 50 mg by mouth at bedtime.   Yes [provider]  lisinopril (PRINIVIL,ZESTRIL) 20 MG tablet Take 20 mg by mouth at bedtime.   Yes [provider]  metoprolol succinate (TOPROL-XL) 25 MG 24 hr tablet Take 25 mg by mouth at bedtime.    Yes [provider]  metoCLOPramide (REGLAN) 10 MG tablet Take 1 tablet (10 mg total) by mouth as directed. Patient not taking: Reported on 12/04/2018 04/24/14   Meryl DareStark, Malcolm T, MD  naproxen (NAPROSYN) 500 MG tablet Take 1 tablet (500 mg total) by mouth 2 (two) times daily with a meal. Patient not taking: Reported on 12/04/2018 08/03/14   Earley FavorSchulz, Gail, NP    Family History Family History  Problem Relation  Age of Onset  . Brain cancer Mother   . Lung cancer Father   . Colon cancer Neg Hx     Social History Social History   Tobacco Use  . Smoking status: Never Smoker  . Smokeless tobacco: Never Used  Substance Use Topics  . Alcohol use: Yes    Comment: rare  . Drug use: No     Allergies   Erythromycin; Shellfish allergy; and Sudafed [pseudoephedrine hcl]   Review of Systems Review of Systems  Constitutional: Negative.  Negative for chills and fever.  HENT: Negative.  Negative for rhinorrhea and sore throat.   Eyes: Negative.  Negative for visual disturbance.  Respiratory: Negative.  Negative for  cough and shortness of breath.   Cardiovascular: Negative.  Negative for chest pain.  Gastrointestinal: Positive for nausea. Negative for abdominal pain, diarrhea and vomiting.  Genitourinary: Negative.  Negative for dysuria and hematuria.  Musculoskeletal: Negative.  Negative for arthralgias and myalgias.  Skin: Negative.  Negative for rash.  Neurological: Positive for light-headedness. Negative for syncope, weakness, numbness and headaches.   Physical Exam Updated Vital Signs BP 124/62   Pulse (!) 113   Temp 98.3 F (36.8 C) (Oral)   Resp 17   Ht 5\' 9"  (1.753 m)   Wt 93 kg   SpO2 99%   BMI 30.27 kg/m   Physical Exam Constitutional:      General: She is not in acute distress.    Appearance: She is well-developed. She is not ill-appearing or diaphoretic.  HENT:     Head: Normocephalic and atraumatic.     Right Ear: Tympanic membrane, ear canal and external ear normal.     Left Ear: Tympanic membrane, ear canal and external ear normal.     Nose: Nose normal.     Mouth/Throat:     Lips: Pink.     Mouth: Mucous membranes are moist.     Pharynx: Oropharynx is clear. Uvula midline.  Eyes:     Extraocular Movements: Extraocular movements intact.     Conjunctiva/sclera: Conjunctivae normal.     Pupils: Pupils are equal, round, and reactive to light.  Neck:     Musculoskeletal: Full passive range of motion without pain, normal range of motion and neck supple.     Trachea: Trachea and phonation normal. No tracheal deviation.  Cardiovascular:     Rate and Rhythm: Normal rate and regular rhythm.     Pulses: Normal pulses.          Radial pulses are 2+ on the right side and 2+ on the left side.       Dorsalis pedis pulses are 2+ on the right side and 2+ on the left side.       Posterior tibial pulses are 2+ on the right side and 2+ on the left side.     Heart sounds: Normal heart sounds.  Pulmonary:     Effort: Pulmonary effort is normal. No respiratory distress.     Breath  sounds: Normal breath sounds and air entry.  Chest:     Chest wall: No tenderness.  Abdominal:     General: Bowel sounds are normal.     Palpations: Abdomen is soft.     Tenderness: There is no abdominal tenderness. There is no guarding or rebound.  Musculoskeletal: Normal range of motion.     Right lower leg: Normal.     Left lower leg: Normal.     Comments: No midline C/T/L spinal tenderness to  palpation, no paraspinal muscle tenderness, no deformity, crepitus, or step-off noted. No sign of injury to the neck or back.  Feet:     Right foot:     Protective Sensation: 3 sites tested. 3 sites sensed.     Left foot:     Protective Sensation: 3 sites tested. 3 sites sensed.  Skin:    General: Skin is warm and dry.     Capillary Refill: Capillary refill takes less than 2 seconds.  Neurological:     General: No focal deficit present.     Mental Status: She is alert and oriented to person, place, and time.     GCS: GCS eye subscore is 4. GCS verbal subscore is 5. GCS motor subscore is 6.     Comments: Mental Status: Alert, oriented, thought content appropriate, able to give a coherent history. Speech fluent without evidence of aphasia. Able to follow 2 step commands without difficulty. Cranial Nerves: II: Peripheral visual fields grossly normal, pupils equal, round, reactive to light III,IV, VI: ptosis not present, extra-ocular motions intact bilaterally V,VII: smile symmetric, eyebrows raise symmetric, facial light touch sensation equal VIII: hearing grossly normal to voice X: uvula elevates symmetrically XI: bilateral shoulder shrug symmetric and strong XII: midline tongue extension without fassiculations Motor: Normal tone. 5/5 strength in upper and lower extremities bilaterally including strong and equal grip strength and dorsiflexion/plantar flexion Sensory: Sensation intact to light touch in all extremities.Negative Romberg.  Cerebellar: normal finger-to-nose with  bilateral upper extremities. Normal heel-to -shin balance bilaterally of the lower extremity. No pronator drift.  Gait: normal gait and balance CV: distal pulses palpable throughout  Psychiatric:        Mood and Affect: Mood normal.        Behavior: Behavior normal.    ED Treatments / Results  Labs (all labs ordered are listed, but only abnormal results are displayed) Labs Reviewed  CBC WITH DIFFERENTIAL/PLATELET - Abnormal; Notable for the following components:      Result Value   WBC 14.7 (*)    Neutro Abs 12.2 (*)    Abs Immature Granulocytes 0.09 (*)    All other components within normal limits  BASIC METABOLIC PANEL - Abnormal; Notable for the following components:   Glucose, Bld 132 (*)    All other components within normal limits  INFLUENZA PANEL BY PCR (TYPE A & B)    EKG EKG Interpretation  Date/Time:  Tuesday December 04 2018 02:47:23 EST Ventricular Rate:  95 PR Interval:    QRS Duration: 102 QT Interval:  367 QTC Calculation: 462 R Axis:   81 Text Interpretation:  Sinus rhythm Normal ECG When compared with ECG of 05/04/2018, No significant change was found Confirmed by Dione BoozeGlick, David ([id number]) on 12/04/2018 2:52:52 AM   Radiology Dg Chest 2 View  Result Date: 12/04/2018 CLINICAL DATA:  Weakness and fevers EXAM: CHEST - 2 VIEW COMPARISON:  08/03/2014 FINDINGS: Cardiac shadow is stable. The lungs are well aerated bilaterally. No focal infiltrate or sizable effusion is seen. No bony abnormality is noted. IMPRESSION: No acute abnormality noted. Electronically Signed   By: Alcide CleverMark  Lukens M.D.   On: 12/04/2018 02:34    Procedures Procedures (including critical care time)  Medications Ordered in ED Medications  sodium chloride 0.9 % bolus 1,000 mL (0 mLs Intravenous Stopped 12/04/18 0443)     Initial Impression / Assessment and Plan / ED Course  I have reviewed the triage vital signs and the nursing notes.  Pertinent labs &  imaging results that were available  during my care of the patient were reviewed by me and considered in my medical decision making (see chart for details).    57 year old female presenting for lightheadedness upon standing that occurred 1 time today.  No chest pain or shortness of breath.  No neurologic complaint or focal neuro deficit on examination today.  Patient well-appearing and in no acute distress.  Sleeping comfortably and easily arousable upon my initial evaluation.  Based on physical examination and history today likely patient is experiencing dehydration from lack of eating and drinking over the past few days.  Plan at this time is to perform CBC, BMP, EKG and give fluid bolus.  Chest x-ray and influenza panel were added due to patient states that she has felt warm over the past few days.  BMP nonacute Influenza panel negative Chest x-ray negative EKG without acute change reviewed by Dr. Preston Fleeting CBC with leukocytosis, patient with mild URI-like symptoms, doubt bacterial infection at this time Fluid bolus given  Case discussed with Dr. Preston Fleeting who agrees that after fluid bolus patient may be discharged.  Patient reassessed after fluid bolus, patient without orthostatic hypotension.  Is ambulatory without difficulty or assistance. Patient states that she wished to be discharged at this time.  Patient without complaints of pain, denies shortness of breath or any other concerns at this time.  At this time there does not appear to be any evidence of an acute emergency medical condition and the patient appears stable for discharge with appropriate outpatient follow up. Diagnosis was discussed with patient who verbalizes understanding of care plan and is agreeable to discharge. I have discussed return precautions with patient who verbalizes understanding of return precautions. Patient strongly encouraged to follow-up with their PCP this week. All questions answered.  Patient's case discussed with Dr. Preston Fleeting who agrees with plan to  discharge with follow-up.   Note: Portions of this report may have been transcribed using voice recognition software. Every effort was made to ensure accuracy; however, inadvertent computerized transcription errors may still be present. Final Clinical Impressions(s) / ED Diagnoses   Final diagnoses:  Dehydration  Orthostatic hypotension    ED Discharge Orders    None       Elizabeth Palau 12/04/18 0654    Dione Booze, MD 12/04/18 [phone]

## 2018-12-04 NOTE — Discharge Instructions (Addendum)
You have been diagnosed today with orthostatic hypotension likely due to dehydration.  At this time there does not appear to be the presence of an emergent medical condition, however there is always the potential for conditions to change. Please read and follow the below instructions.  Please return to the Emergency Department immediately for any new or worsening symptoms. Please be sure to follow up with your Primary Care Provider this week regarding your visit today; please call their office to schedule an appointment even if you are feeling better for a follow-up visit. Please drink plenty of water and eat plenty food to help with your symptoms.   Get help right away if: You have symptoms of severe dehydration. You cannot drink fluids without vomiting. Your symptoms get worse with treatment. You have a fever. You have a severe headache. You have vomiting or diarrhea that: Gets worse. Does not go away. You have blood or green matter (bile) in your vomit. You have blood in your stool. This may cause stool to look black and tarry. You have not urinated in 6-8 hours. You faint. Your heart rate while sitting still is over 100 beats a minute. You have trouble breathing. Get help right away if you: Have chest pain. Have a fast or irregular heartbeat. Develop numbness in any part of your body. Cannot move your arms or your legs. Have trouble speaking. Become sweaty or feel light-headed. Faint. Feel short of breath. Have trouble staying awake. Feel confused.  Please read the additional information packets attached to your discharge summary.  Do not take your medicine if  develop an itchy rash, swelling in your mouth or lips, or difficulty breathing.

## 2021-09-20 ENCOUNTER — Other Ambulatory Visit: Payer: Self-pay | Admitting: Nurse Practitioner

## 2021-09-20 DIAGNOSIS — Z1231 Encounter for screening mammogram for malignant neoplasm of breast: Secondary | ICD-10-CM

## 2021-10-22 ENCOUNTER — Ambulatory Visit
Admission: RE | Admit: 2021-10-22 | Discharge: 2021-10-22 | Disposition: A | Discharge status: Home / Self Care | Payer: 59 | Source: Ambulatory Visit | Attending: Nurse Practitioner | Admitting: Nurse Practitioner

## 2021-10-22 DIAGNOSIS — Z1231 Encounter for screening mammogram for malignant neoplasm of breast: Secondary | ICD-10-CM

## 2021-10-26 ENCOUNTER — Other Ambulatory Visit: Payer: Self-pay | Admitting: Nurse Practitioner

## 2021-10-26 DIAGNOSIS — R928 Other abnormal and inconclusive findings on diagnostic imaging of breast: Secondary | ICD-10-CM

## 2021-11-23 ENCOUNTER — Other Ambulatory Visit: Payer: Self-pay | Admitting: Family Medicine

## 2021-11-24 ENCOUNTER — Ambulatory Visit: Admission: RE | Admit: 2021-11-24 | Payer: 59 | Source: Ambulatory Visit

## 2021-11-24 ENCOUNTER — Ambulatory Visit
Admission: RE | Admit: 2021-11-24 | Discharge: 2021-11-24 | Disposition: A | Discharge status: Home / Self Care | Payer: 59 | Source: Ambulatory Visit | Attending: Nurse Practitioner | Admitting: Nurse Practitioner

## 2021-11-24 DIAGNOSIS — R928 Other abnormal and inconclusive findings on diagnostic imaging of breast: Secondary | ICD-10-CM

## 2022-01-15 ENCOUNTER — Other Ambulatory Visit: Payer: Self-pay | Admitting: Nurse Practitioner

## 2022-01-15 DIAGNOSIS — R928 Other abnormal and inconclusive findings on diagnostic imaging of breast: Secondary | ICD-10-CM

## 2022-08-09 IMAGING — MG MM DIGITAL DIAGNOSTIC UNILAT*R* W/ TOMO W/ CAD
6 series · 6 of 18 positions shown · non-contrast
Comparison: Previous exam(s).

CLINICAL DATA: Patient was recalled from screening mammogram for a
possible mass in the right breast.

EXAM:
DIGITAL DIAGNOSTIC UNILATERAL RIGHT MAMMOGRAM WITH TOMOSYNTHESIS AND
CAD
TECHNIQUE: Right digital diagnostic mammography and breast tomosynthesis was
performed. The images were evaluated with computer-aided detection.

[R MLO synth-2D (1 of 2)]
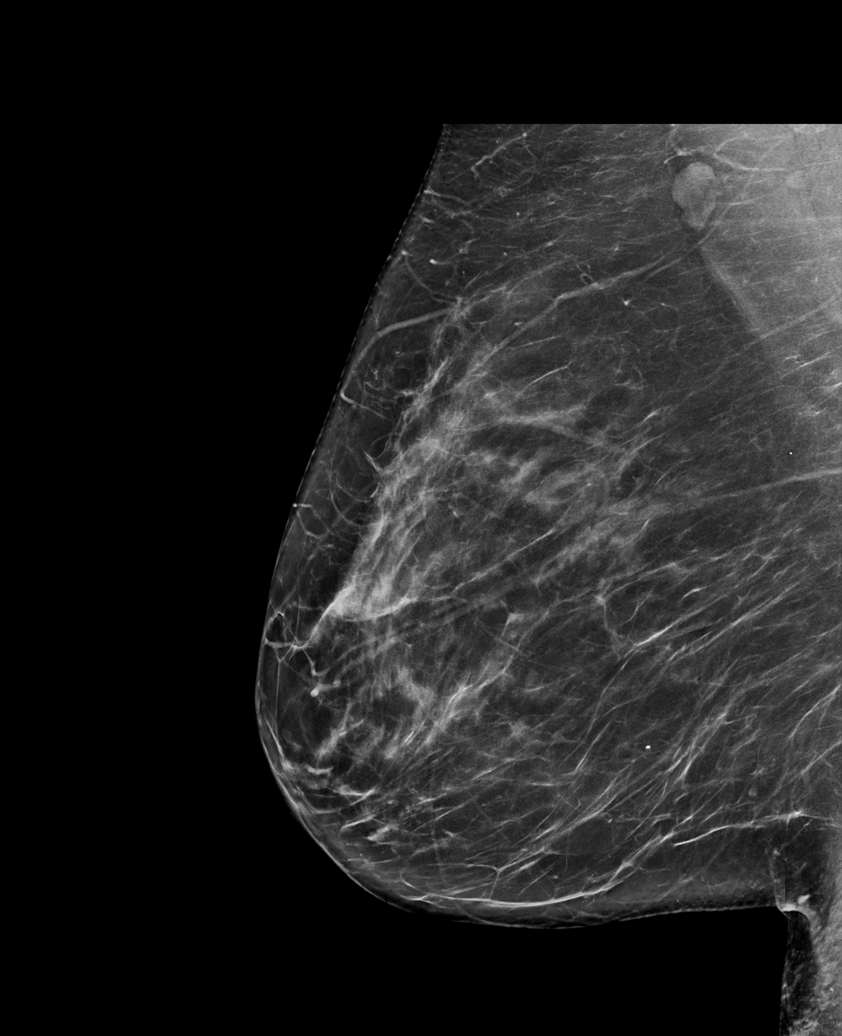

[R MLO synth-2D (2 of 2)]
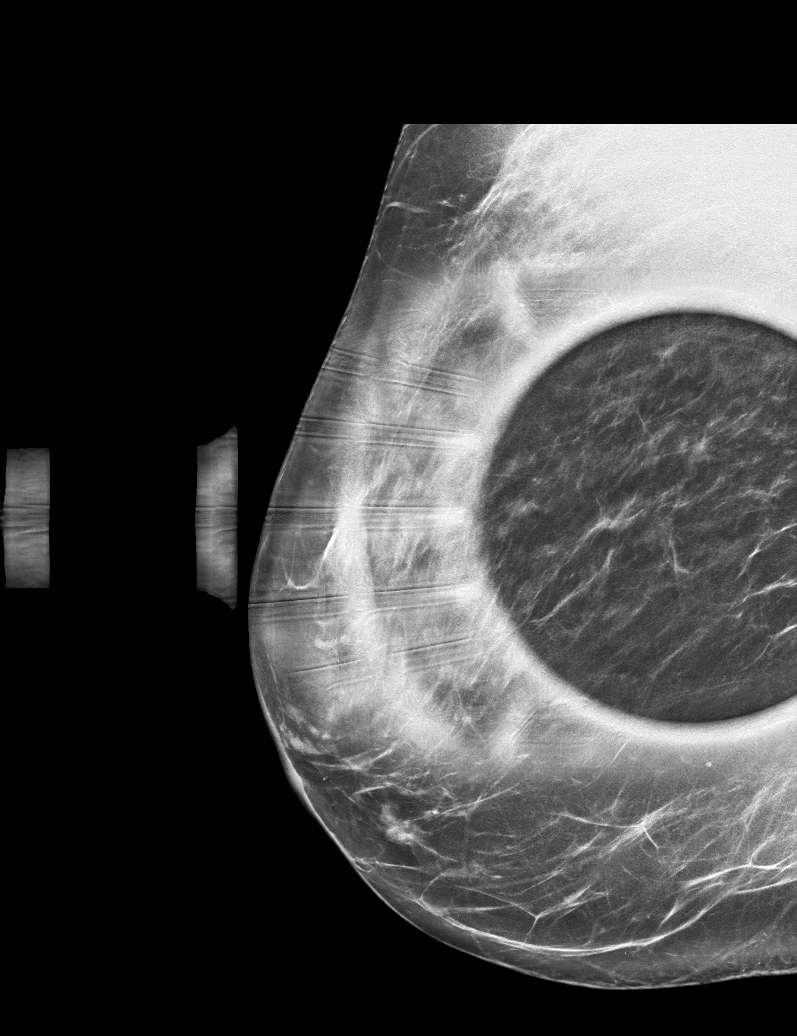

[R ML synth-2D]
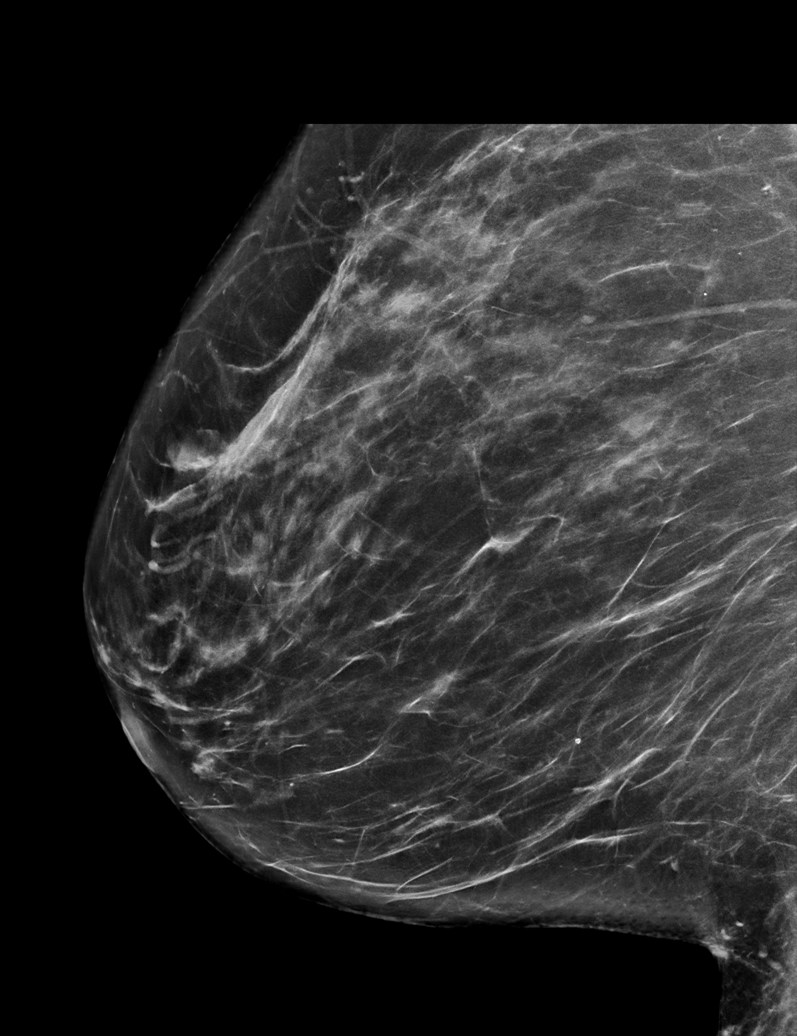

[R MLO tomo (1 of 2) · tomo slice 43/86.0]
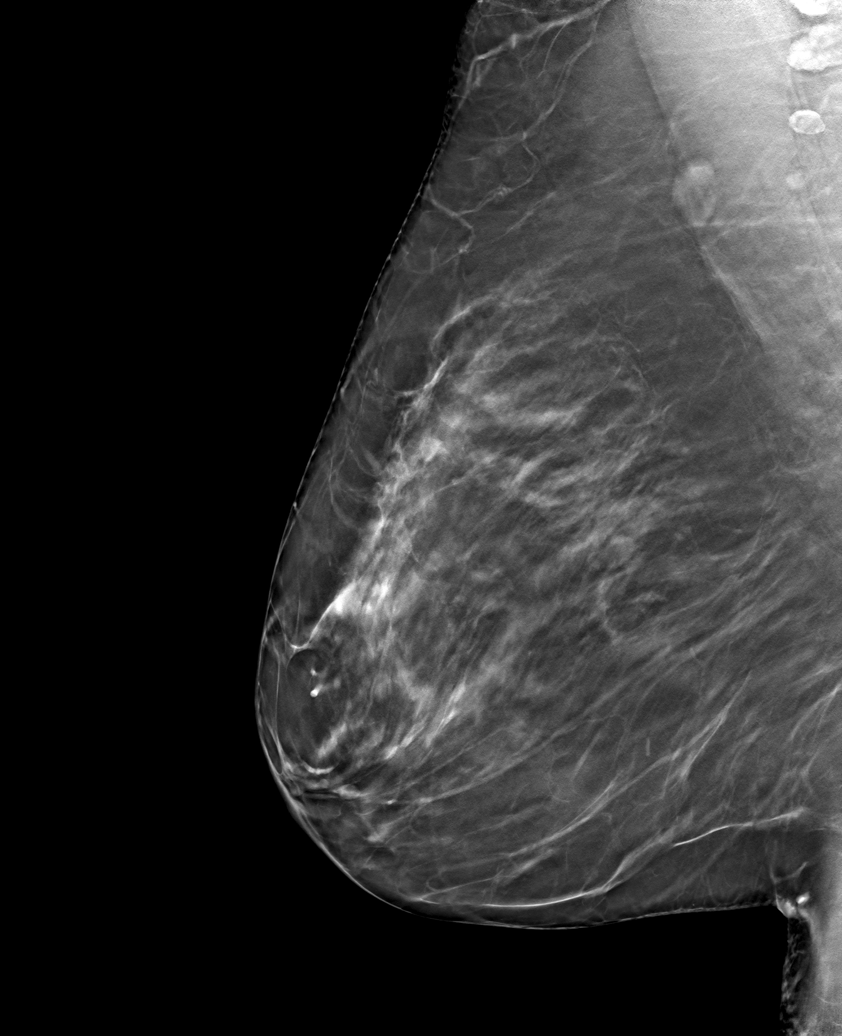

[R ML tomo · tomo slice 41/80.0]
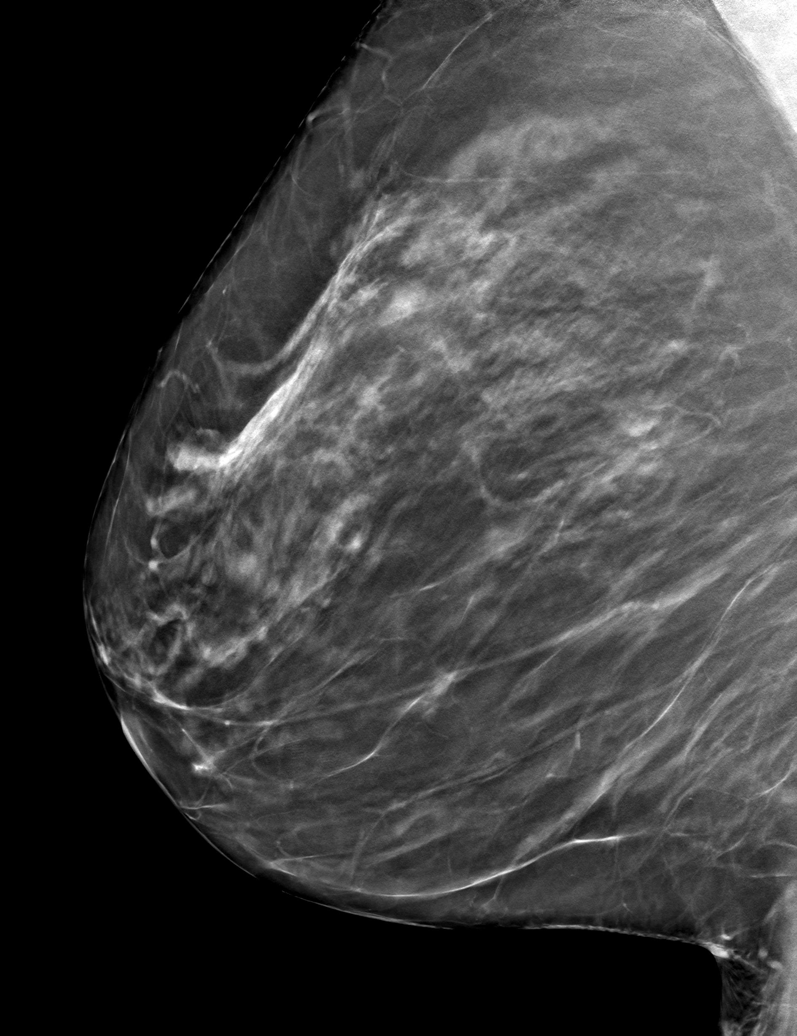

[R MLO tomo (2 of 2) · tomo slice 33/65.0]
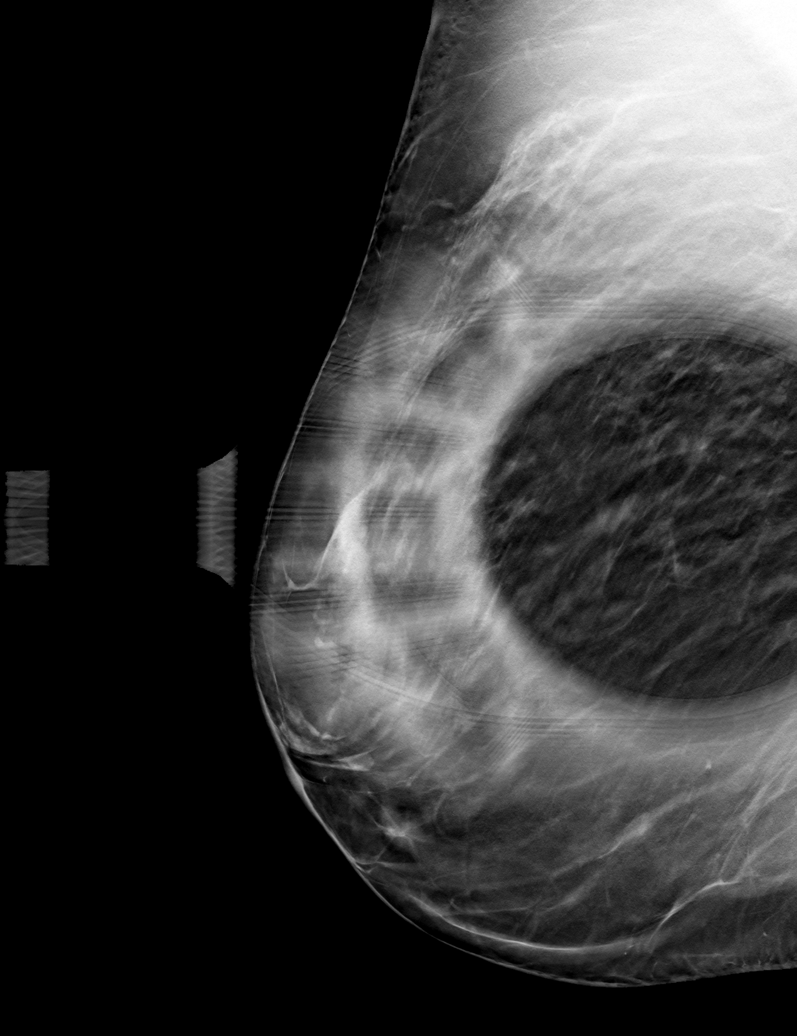

[6 of 18 positions shown; findings below may reference images not displayed]

ACR Breast Density Category c: The breast tissue is heterogeneously
dense, which may obscure small masses.
FINDINGS: Additional imaging of the right breast was performed. No suspicious
mass, malignant type microcalcifications or distortion detected.
IMPRESSION: No evidence of malignancy in the right breast.

RECOMMENDATION:
Bilateral screening mammogram in 1 year is recommended.

I have discussed the findings and recommendations with the patient.
If applicable, a reminder letter will be sent to the patient
regarding the next appointment.

BI-RADS CATEGORY  1: Negative.

## 2022-09-27 ENCOUNTER — Other Ambulatory Visit: Payer: Self-pay | Admitting: Physician Assistant

## 2022-09-27 DIAGNOSIS — Z1231 Encounter for screening mammogram for malignant neoplasm of breast: Secondary | ICD-10-CM

## 2022-11-15 ENCOUNTER — Ambulatory Visit: Payer: 59

## 2023-01-10 ENCOUNTER — Ambulatory Visit: Payer: 59

## 2023-02-24 ENCOUNTER — Ambulatory Visit
Admission: RE | Admit: 2023-02-24 | Discharge: 2023-02-24 | Disposition: A | Discharge status: Home / Self Care | Payer: 59 | Source: Ambulatory Visit | Attending: Physician Assistant | Admitting: Physician Assistant

## 2023-02-24 DIAGNOSIS — Z1231 Encounter for screening mammogram for malignant neoplasm of breast: Secondary | ICD-10-CM

## 2023-03-20 ENCOUNTER — Other Ambulatory Visit (HOSPITAL_BASED_OUTPATIENT_CLINIC_OR_DEPARTMENT_OTHER): Payer: Self-pay

## 2023-03-20 MED ORDER — AMPHETAMINE-DEXTROAMPHETAMINE 20 MG PO TABS
20.0000 mg | ORAL_TABLET | Freq: Two times a day (BID) | ORAL | 0 refills | Status: AC
  Filled 2023-03-20: qty 60, 30d supply, fill #0

## 2023-04-17 ENCOUNTER — Other Ambulatory Visit (HOSPITAL_BASED_OUTPATIENT_CLINIC_OR_DEPARTMENT_OTHER): Payer: Self-pay

## 2023-04-17 MED ORDER — AMPHETAMINE-DEXTROAMPHETAMINE 20 MG PO TABS
20.0000 mg | ORAL_TABLET | Freq: Two times a day (BID) | ORAL | 0 refills | Status: AC
  Filled 2023-05-22: qty 60, 30d supply, fill #0

## 2023-04-17 MED ORDER — AMPHETAMINE-DEXTROAMPHETAMINE 20 MG PO TABS
20.0000 mg | ORAL_TABLET | Freq: Two times a day (BID) | ORAL | 0 refills | Status: AC
  Filled 2023-04-20: qty 60, 30d supply, fill #0

## 2023-04-20 ENCOUNTER — Other Ambulatory Visit (HOSPITAL_BASED_OUTPATIENT_CLINIC_OR_DEPARTMENT_OTHER): Payer: Self-pay

## 2023-04-20 ENCOUNTER — Other Ambulatory Visit: Payer: Self-pay

## 2023-05-18 ENCOUNTER — Other Ambulatory Visit (HOSPITAL_BASED_OUTPATIENT_CLINIC_OR_DEPARTMENT_OTHER): Payer: Self-pay

## 2023-05-19 ENCOUNTER — Encounter (HOSPITAL_BASED_OUTPATIENT_CLINIC_OR_DEPARTMENT_OTHER): Payer: Self-pay | Admitting: Pharmacist

## 2023-05-19 ENCOUNTER — Other Ambulatory Visit (HOSPITAL_BASED_OUTPATIENT_CLINIC_OR_DEPARTMENT_OTHER): Payer: Self-pay

## 2023-05-20 ENCOUNTER — Other Ambulatory Visit (HOSPITAL_BASED_OUTPATIENT_CLINIC_OR_DEPARTMENT_OTHER): Payer: Self-pay

## 2023-05-22 ENCOUNTER — Other Ambulatory Visit (HOSPITAL_BASED_OUTPATIENT_CLINIC_OR_DEPARTMENT_OTHER): Payer: Self-pay

## 2023-05-22 ENCOUNTER — Other Ambulatory Visit: Payer: Self-pay

## 2023-06-12 ENCOUNTER — Other Ambulatory Visit (HOSPITAL_BASED_OUTPATIENT_CLINIC_OR_DEPARTMENT_OTHER): Payer: Self-pay

## 2023-06-12 MED ORDER — FLUOXETINE HCL 10 MG PO CAPS
ORAL_CAPSULE | ORAL | 0 refills | Status: AC
  Filled 2023-06-12: qty 90, 90d supply, fill #0

## 2023-06-12 MED ORDER — FLUOXETINE HCL 20 MG PO CAPS
20.0000 mg | ORAL_CAPSULE | Freq: Every day | ORAL | 0 refills | Status: DC
  Filled 2023-06-12: qty 90, 90d supply, fill #0

## 2023-06-13 ENCOUNTER — Other Ambulatory Visit (HOSPITAL_BASED_OUTPATIENT_CLINIC_OR_DEPARTMENT_OTHER): Payer: Self-pay

## 2023-06-14 ENCOUNTER — Other Ambulatory Visit (HOSPITAL_BASED_OUTPATIENT_CLINIC_OR_DEPARTMENT_OTHER): Payer: Self-pay

## 2023-06-14 MED ORDER — AMPHETAMINE-DEXTROAMPHETAMINE 20 MG PO TABS
ORAL_TABLET | ORAL | 0 refills | Status: AC
  Filled 2023-06-22: qty 60, 30d supply, fill #0

## 2023-06-14 MED ORDER — AMPHETAMINE-DEXTROAMPHETAMINE 20 MG PO TABS
ORAL_TABLET | ORAL | 0 refills | Status: AC
  Filled 2023-08-23: qty 60, 30d supply, fill #0

## 2023-06-14 MED ORDER — AMPHETAMINE-DEXTROAMPHETAMINE 20 MG PO TABS
ORAL_TABLET | ORAL | 0 refills | Status: AC
  Filled 2023-07-24: qty 60, 30d supply, fill #0

## 2023-06-22 ENCOUNTER — Other Ambulatory Visit (HOSPITAL_BASED_OUTPATIENT_CLINIC_OR_DEPARTMENT_OTHER): Payer: Self-pay

## 2023-07-18 ENCOUNTER — Other Ambulatory Visit (HOSPITAL_BASED_OUTPATIENT_CLINIC_OR_DEPARTMENT_OTHER): Payer: Self-pay

## 2023-07-24 ENCOUNTER — Other Ambulatory Visit (HOSPITAL_BASED_OUTPATIENT_CLINIC_OR_DEPARTMENT_OTHER): Payer: Self-pay

## 2023-08-23 ENCOUNTER — Other Ambulatory Visit (HOSPITAL_BASED_OUTPATIENT_CLINIC_OR_DEPARTMENT_OTHER): Payer: Self-pay

## 2023-08-30 ENCOUNTER — Other Ambulatory Visit (HOSPITAL_BASED_OUTPATIENT_CLINIC_OR_DEPARTMENT_OTHER): Payer: Self-pay

## 2023-08-30 MED ORDER — AMPHETAMINE-DEXTROAMPHETAMINE 20 MG PO TABS
ORAL_TABLET | ORAL | 0 refills | Status: DC
  Filled 2023-10-18: qty 60, 30d supply, fill #0

## 2023-08-30 MED ORDER — FLUOXETINE HCL 20 MG PO CAPS
20.0000 mg | ORAL_CAPSULE | Freq: Every day | ORAL | 1 refills | Status: AC
  Filled 2023-08-30 (×2): qty 90, 90d supply, fill #0

## 2023-08-30 MED ORDER — AMPHETAMINE-DEXTROAMPHETAMINE 20 MG PO TABS
20.0000 mg | ORAL_TABLET | Freq: Two times a day (BID) | ORAL | 0 refills | Status: AC
  Filled 2023-09-20: qty 60, 30d supply, fill #0

## 2023-09-11 ENCOUNTER — Other Ambulatory Visit (HOSPITAL_BASED_OUTPATIENT_CLINIC_OR_DEPARTMENT_OTHER): Payer: Self-pay

## 2023-09-20 ENCOUNTER — Other Ambulatory Visit (HOSPITAL_BASED_OUTPATIENT_CLINIC_OR_DEPARTMENT_OTHER): Payer: Self-pay

## 2023-10-18 ENCOUNTER — Other Ambulatory Visit (HOSPITAL_BASED_OUTPATIENT_CLINIC_OR_DEPARTMENT_OTHER): Payer: Self-pay

## 2023-10-31 ENCOUNTER — Other Ambulatory Visit: Payer: Self-pay

## 2023-10-31 ENCOUNTER — Other Ambulatory Visit (HOSPITAL_BASED_OUTPATIENT_CLINIC_OR_DEPARTMENT_OTHER): Payer: Self-pay

## 2023-10-31 MED ORDER — AMPHETAMINE-DEXTROAMPHETAMINE 20 MG PO TABS
ORAL_TABLET | ORAL | 0 refills | Status: AC
  Filled 2023-10-31 – 2023-11-17 (×2): qty 60, 30d supply, fill #0

## 2023-10-31 MED ORDER — FLUOXETINE HCL 20 MG PO CAPS
20.0000 mg | ORAL_CAPSULE | Freq: Every day | ORAL | 1 refills | Status: AC
  Filled 2023-10-31 – 2023-11-17 (×2): qty 90, 90d supply, fill #0

## 2023-11-10 ENCOUNTER — Other Ambulatory Visit (HOSPITAL_BASED_OUTPATIENT_CLINIC_OR_DEPARTMENT_OTHER): Payer: Self-pay

## 2023-11-17 ENCOUNTER — Other Ambulatory Visit (HOSPITAL_BASED_OUTPATIENT_CLINIC_OR_DEPARTMENT_OTHER): Payer: Self-pay

## 2023-11-23 ENCOUNTER — Other Ambulatory Visit (HOSPITAL_BASED_OUTPATIENT_CLINIC_OR_DEPARTMENT_OTHER): Payer: Self-pay

## 2023-11-27 ENCOUNTER — Other Ambulatory Visit (HOSPITAL_BASED_OUTPATIENT_CLINIC_OR_DEPARTMENT_OTHER): Payer: Self-pay

## 2024-02-21 ENCOUNTER — Other Ambulatory Visit (HOSPITAL_BASED_OUTPATIENT_CLINIC_OR_DEPARTMENT_OTHER): Payer: Self-pay

## 2024-05-07 ENCOUNTER — Ambulatory Visit
Admission: RE | Admit: 2024-05-07 | Discharge: 2024-05-07 | Disposition: A | Discharge status: Home / Self Care | Source: Ambulatory Visit | Attending: Family | Admitting: Family

## 2024-05-07 ENCOUNTER — Other Ambulatory Visit: Payer: Self-pay | Admitting: Family

## 2024-05-07 DIAGNOSIS — R051 Acute cough: Secondary | ICD-10-CM

## 2024-08-01 ENCOUNTER — Other Ambulatory Visit: Payer: Self-pay | Admitting: Family

## 2024-08-01 DIAGNOSIS — Z1231 Encounter for screening mammogram for malignant neoplasm of breast: Secondary | ICD-10-CM

## 2024-08-20 ENCOUNTER — Ambulatory Visit
Admission: RE | Admit: 2024-08-20 | Discharge: 2024-08-20 | Disposition: A | Discharge status: Home / Self Care | Source: Ambulatory Visit | Attending: Family | Admitting: Family

## 2024-08-20 DIAGNOSIS — Z1231 Encounter for screening mammogram for malignant neoplasm of breast: Secondary | ICD-10-CM

## 2024-09-23 ENCOUNTER — Other Ambulatory Visit (HOSPITAL_BASED_OUTPATIENT_CLINIC_OR_DEPARTMENT_OTHER): Payer: Self-pay

## 2024-11-22 ENCOUNTER — Emergency Department (HOSPITAL_BASED_OUTPATIENT_CLINIC_OR_DEPARTMENT_OTHER)
Admission: EM | Admit: 2024-11-22 | Discharge: 2024-11-22 | Disposition: A | Discharge status: Home / Self Care | Attending: Emergency Medicine | Admitting: Emergency Medicine

## 2024-11-22 ENCOUNTER — Other Ambulatory Visit (HOSPITAL_BASED_OUTPATIENT_CLINIC_OR_DEPARTMENT_OTHER): Payer: Self-pay

## 2024-11-22 ENCOUNTER — Other Ambulatory Visit: Payer: Self-pay

## 2024-11-22 ENCOUNTER — Encounter (HOSPITAL_BASED_OUTPATIENT_CLINIC_OR_DEPARTMENT_OTHER): Payer: Self-pay

## 2024-11-22 DIAGNOSIS — J101 Influenza due to other identified influenza virus with other respiratory manifestations: Secondary | ICD-10-CM | POA: Insufficient documentation

## 2024-11-22 DIAGNOSIS — Z79899 Other long term (current) drug therapy: Secondary | ICD-10-CM | POA: Insufficient documentation

## 2024-11-22 DIAGNOSIS — J111 Influenza due to unidentified influenza virus with other respiratory manifestations: Secondary | ICD-10-CM

## 2024-11-22 DIAGNOSIS — R110 Nausea: Secondary | ICD-10-CM | POA: Insufficient documentation

## 2024-11-22 LAB — RESP PANEL BY RT-PCR (RSV, FLU A&B, COVID)  RVPGX2
Influenza A by PCR: POSITIVE — AB
Influenza B by PCR: NEGATIVE
Resp Syncytial Virus by PCR: NEGATIVE
SARS Coronavirus 2 by RT PCR: NEGATIVE

## 2024-11-22 MED ORDER — PROMETHAZINE HCL 25 MG PO TABS
25.0000 mg | ORAL_TABLET | Freq: Four times a day (QID) | ORAL | 0 refills | Status: AC | PRN
  Filled 2024-11-22: qty 30, 8d supply, fill #0

## 2024-11-22 MED ORDER — PROMETHAZINE HCL 25 MG PO TABS
25.0000 mg | ORAL_TABLET | Freq: Once | ORAL | Status: AC
  Administered 2024-11-22: 25 mg via ORAL
  Filled 2024-11-22: qty 1

## 2024-11-22 NOTE — Discharge Instructions (Signed)
 Continue oral hydration at home.  Recommend bland diet.  Return if symptoms worsen as we discussed.  Use Phenergan as needed for nausea and vomiting.

## 2024-11-22 NOTE — ED Triage Notes (Signed)
 Pt reports flu like S/S x1 day. Pt reports nausea and decreased appetite.

## 2024-11-22 NOTE — ED Provider Notes (Signed)
 [place] EMERGENCY DEPARTMENT AT [facility] Provider Note   CSN: [id number] Arrival date & time: 11/22/24  1650     Patient presents with: Influenza   Regina Mercado is a 63 y.o. female.   Patient here with flulike symptoms for the last few days.  Family members also sick with influenza.  She has had a fair amount of diarrhea but that is mostly resolved now she is feeling mostly nauseous having a hard time with her appetite.  She does not tolerate Zofran.  Ultimately she denies any syncope abdominal pain chest pain shortness of breath.  No obvious fevers.  No sore throat.  The history is provided by the patient.       Prior to Admission medications  Medication Sig Start Date End Date Taking? Authorizing Provider  promethazine (PHENERGAN) 25 MG tablet Take 1 tablet (25 mg total) by mouth every 6 (six) hours as needed for nausea or vomiting. 11/22/24  Yes Shalaine Payson, DO  acetaminophen  (TYLENOL ) 500 MG tablet Take 500 mg by mouth every 6 (six) hours as needed for mild pain.     [provider]  amphetamine -dextroamphetamine  (ADDERALL) 20 MG tablet Take 1 tablet (20 mg total) by mouth in the morning and at noon. 03/20/23     amphetamine -dextroamphetamine  (ADDERALL) 20 MG tablet Take one tablet (20mg ) by mouth every morning and take one tablet (20mg ) by mouth daily at noon. 04/20/23     amphetamine -dextroamphetamine  (ADDERALL) 20 MG tablet Take one tablet (20mg ) by mouth every morning and take one tablet (20mg ) by mouth daily at noon. 05/21/23     amphetamine -dextroamphetamine  (ADDERALL) 20 MG tablet Take 1 tablet (20 mg total) by mouth in the morning AND 1 tablet (20 mg total) daily at 12 noon. 08/23/23 09/22/23    amphetamine -dextroamphetamine  (ADDERALL) 20 MG tablet Take 1 tablet (20 mg total) by mouth in the morning AND 1 tablet (20 mg total) daily at 12 noon. 07/23/23 08/23/23    amphetamine -dextroamphetamine  (ADDERALL) 20 MG tablet Take 1 tablet (20 mg total) by  mouth in the morning AND 1 tablet (20 mg total) daily at 12 noon. 06/22/23 07/22/23    amphetamine -dextroamphetamine  (ADDERALL) 20 MG tablet Take 1 tablet (20 mg total) by mouth in the morning and take 1 tablet by mouth at noon. 09/20/23     amphetamine -dextroamphetamine  (ADDERALL) 20 MG tablet Take 1 tablet (20 mg total) by mouth in the morning AND 1 tablet (20 mg total) daily at 12 noon. 10/31/23     atorvastatin (LIPITOR) 10 MG tablet Take 10 mg by mouth at bedtime.     [provider]  esomeprazole (NEXIUM) 20 MG capsule Take 20 mg by mouth daily as needed.    [provider]  FLUoxetine  (PROZAC ) 10 MG capsule TAKE ONE CAPSULE BY MOUTH (10MG ) DAILY ALONG WITH THE 20MG . 06/12/23     FLUoxetine  (PROZAC ) 20 MG capsule Take 1 capsule (20 mg total) by mouth daily. 08/30/23     FLUoxetine  (PROZAC ) 20 MG capsule Take 1 capsule (20 mg total) by mouth daily. 10/31/23     hydrochlorothiazide (HYDRODIURIL) 25 MG tablet Take 25 mg by mouth at bedtime.    [provider]  ibuprofen (ADVIL,MOTRIN) 200 MG tablet Take 200 mg by mouth every 6 (six) hours as needed for mild pain.     [provider]  imipramine (TOFRANIL) 50 MG tablet Take 50 mg by mouth at bedtime.    [provider]  lisinopril (PRINIVIL,ZESTRIL) 20 MG tablet  Take 20 mg by mouth at bedtime.    [provider]  metoCLOPramide  (REGLAN ) 10 MG tablet Take 1 tablet (10 mg total) by mouth as directed. Patient not taking: Reported on 12/04/2018 04/24/14   Aneita Gwendlyn DASEN, MD  metoprolol succinate (TOPROL-XL) 25 MG 24 hr tablet Take 25 mg by mouth at bedtime.     [provider]  naproxen  (NAPROSYN ) 500 MG tablet Take 1 tablet (500 mg total) by mouth 2 (two) times daily with a meal. Patient not taking: Reported on 12/04/2018 08/03/14   Terryl Kubas, NP    Allergies: Erythromycin, Shellfish allergy, and Sudafed [pseudoephedrine hcl]    Review of Systems  Updated Vital Signs BP 120/79    Pulse 89   Temp 98.4 F (36.9 C) (Oral)   Resp 19   Ht 5' 8 (1.727 m)   Wt 102.1 kg   SpO2 94%   BMI 34.21 kg/m   Physical Exam Vitals and nursing note reviewed.  Constitutional:      General: She is not in acute distress.    Appearance: She is well-developed. She is not ill-appearing.  HENT:     Head: Normocephalic and atraumatic.     Nose: Nose normal.     Mouth/Throat:     Mouth: Mucous membranes are moist.  Eyes:     Extraocular Movements: Extraocular movements intact.     Conjunctiva/sclera: Conjunctivae normal.     Pupils: Pupils are equal, round, and reactive to light.  Cardiovascular:     Rate and Rhythm: Normal rate and regular rhythm.     Pulses: Normal pulses.     Heart sounds: Normal heart sounds. No murmur heard. Pulmonary:     Effort: Pulmonary effort is normal. No respiratory distress.     Breath sounds: Normal breath sounds.  Abdominal:     General: Abdomen is flat.     Palpations: Abdomen is soft.     Tenderness: There is no abdominal tenderness.  Musculoskeletal:        General: No swelling.     Cervical back: Normal range of motion and neck supple.  Skin:    General: Skin is warm and dry.     Capillary Refill: Capillary refill takes less than 2 seconds.  Neurological:     General: No focal deficit present.     Mental Status: She is alert.  Psychiatric:        Mood and Affect: Mood normal.     (all labs ordered are listed, but only abnormal results are displayed) Labs Reviewed  RESP PANEL BY RT-PCR (RSV, FLU A&B, COVID)  [id number]    EKG: None  Radiology: No results found.   Procedures   Medications Ordered in the ED  promethazine (PHENERGAN) tablet 25 mg (has no administration in time range)                                    Medical Decision Making Risk Prescription drug management.   Regina Mercado is here with flulike symptoms.  Multiple family members in the household with flu.  She has normal vitals.  No fever.  She  has not had much of an appetite as she has been pretty nauseous.  She had diarrhea yesterday.  She has not vomited but she is just has not felt energetic enough to eat.  She has not passed out.  She has no sore throat.  Overall we will give her Phenergan to help with the nausea feeling and recommend that she try to hydrate aggressively with water and Gatorade type products.  She has not passed out.  Reassuring vitals.  I think at this time we can hold IV fluids which patient is agreeable to.  My hope is that Phenergan will give her enough symptomatic support that she can hydrate at home well.  Symptoms of only been for 2 days.  Have no concern for secondary pneumonia or any other acute process.  I told her to return if symptoms worsen.  Discharge.  This chart was dictated using voice recognition software.  Despite best efforts to proofread,  errors can occur which can change the documentation meaning.      Final diagnoses:  Nausea  Flu    ED Discharge Orders          Ordered    promethazine (PHENERGAN) 25 MG tablet  Every 6 hours PRN        11/22/24 1702               Ruthe Cornet, DO 11/22/24 1705
